# Patient Record
Sex: Male | Born: 1975 | Hispanic: Yes | Marital: Married | Smoking: Never smoker
Health system: Southern US, Community
[De-identification: ages and names within clinical notes are randomized; demographics above are authoritative.]

## PROBLEM LIST (undated history)

## (undated) DIAGNOSIS — E119 Type 2 diabetes mellitus without complications: Secondary | ICD-10-CM

## (undated) DIAGNOSIS — K8020 Calculus of gallbladder without cholecystitis without obstruction: Secondary | ICD-10-CM

---

## 2015-12-15 HISTORY — PX: SHOULDER SURGERY: SHX246

## 2015-12-21 ENCOUNTER — Encounter: Payer: Self-pay | Admitting: Emergency Medicine

## 2015-12-21 ENCOUNTER — Emergency Department: Payer: Self-pay

## 2015-12-21 ENCOUNTER — Emergency Department
Admission: EM | Admit: 2015-12-21 | Discharge: 2015-12-21 | Disposition: A | Discharge status: Home / Self Care | Payer: Self-pay | Attending: Emergency Medicine | Admitting: Emergency Medicine

## 2015-12-21 DIAGNOSIS — IMO0001 Reserved for inherently not codable concepts without codable children: Secondary | ICD-10-CM

## 2015-12-21 DIAGNOSIS — Y9289 Other specified places as the place of occurrence of the external cause: Secondary | ICD-10-CM | POA: Insufficient documentation

## 2015-12-21 DIAGNOSIS — Y998 Other external cause status: Secondary | ICD-10-CM | POA: Insufficient documentation

## 2015-12-21 DIAGNOSIS — Z7984 Long term (current) use of oral hypoglycemic drugs: Secondary | ICD-10-CM | POA: Insufficient documentation

## 2015-12-21 DIAGNOSIS — S41112A Laceration without foreign body of left upper arm, initial encounter: Secondary | ICD-10-CM | POA: Insufficient documentation

## 2015-12-21 DIAGNOSIS — Y658 Other specified misadventures during surgical and medical care: Secondary | ICD-10-CM | POA: Insufficient documentation

## 2015-12-21 DIAGNOSIS — E119 Type 2 diabetes mellitus without complications: Secondary | ICD-10-CM | POA: Insufficient documentation

## 2015-12-21 DIAGNOSIS — T814XXA Infection following a procedure, initial encounter: Secondary | ICD-10-CM | POA: Insufficient documentation

## 2015-12-21 DIAGNOSIS — S40812A Abrasion of left upper arm, initial encounter: Secondary | ICD-10-CM | POA: Insufficient documentation

## 2015-12-21 DIAGNOSIS — X58XXXA Exposure to other specified factors, initial encounter: Secondary | ICD-10-CM | POA: Insufficient documentation

## 2015-12-21 DIAGNOSIS — Z4889 Encounter for other specified surgical aftercare: Secondary | ICD-10-CM | POA: Insufficient documentation

## 2015-12-21 DIAGNOSIS — Y9389 Activity, other specified: Secondary | ICD-10-CM | POA: Insufficient documentation

## 2015-12-21 HISTORY — DX: Type 2 diabetes mellitus without complications: E11.9

## 2015-12-21 LAB — CBC
HCT: 36.4 % — ABNORMAL LOW (ref 40.0–52.0)
Hemoglobin: 12.8 g/dL — ABNORMAL LOW (ref 13.0–18.0)
MCH: 30 pg (ref 26.0–34.0)
MCHC: 35.2 g/dL (ref 32.0–36.0)
MCV: 85.3 fL (ref 80.0–100.0)
PLATELETS: 281 10*3/uL (ref 150–440)
RBC: 4.27 MIL/uL — ABNORMAL LOW (ref 4.40–5.90)
RDW: 12.8 % (ref 11.5–14.5)
WBC: 12 10*3/uL — AB (ref 3.8–10.6)

## 2015-12-21 LAB — BASIC METABOLIC PANEL
Anion gap: 9 (ref 5–15)
BUN: 14 mg/dL (ref 6–20)
CHLORIDE: 98 mmol/L — AB (ref 101–111)
CO2: 26 mmol/L (ref 22–32)
Calcium: 8.7 mg/dL — ABNORMAL LOW (ref 8.9–10.3)
Creatinine, Ser: 0.65 mg/dL (ref 0.61–1.24)
GLUCOSE: 130 mg/dL — AB (ref 65–99)
POTASSIUM: 4.1 mmol/L (ref 3.5–5.1)
SODIUM: 133 mmol/L — AB (ref 135–145)

## 2015-12-21 LAB — GLUCOSE, CAPILLARY: Glucose-Capillary: 133 mg/dL — ABNORMAL HIGH (ref 65–99)

## 2015-12-21 MED ORDER — HYDROMORPHONE HCL 2 MG PO TABS
2.0000 mg | ORAL_TABLET | Freq: Once | ORAL | Status: AC
  Administered 2015-12-21: 2 mg via ORAL
  Filled 2015-12-21: qty 1

## 2015-12-21 MED ORDER — HYDROMORPHONE HCL 2 MG PO TABS: 2.0000 mg | ORAL_TABLET | Freq: Two times a day (BID) | ORAL | Status: DC | PRN

## 2015-12-21 MED ORDER — CEPHALEXIN 500 MG PO CAPS
500.0000 mg | ORAL_CAPSULE | Freq: Four times a day (QID) | ORAL | Status: AC
Start: 2015-12-21 — End: 2015-12-31

## 2015-12-21 NOTE — ED Provider Notes (Signed)
Time Seen: Approximately *1300  I have reviewed the triage notes  Chief Complaint: No chief complaint on file.   History of Present Illness: Melvin Wood is a 40 y.o. male *who apparently had surgery performed this past Friday in [place] after a traumatic injury to his left upper extremity. The patient apparently had his surgery and [place] and was referred back home here to the [place] area. Patient himself speaks adequate English and also has a spouse with family who also speaks [place]. The patient states that he initially fell at a construction site. The pictures that his wife has with him shows the original fracture to be midshaft humerus. The patient comes to the emergency department mainly for some evaluation of some blisters and drainage from his axillary area with concerns that the wound may be infected. The patient has not had a fever at home. Past Medical History  Diagnosis Date  . Diabetes mellitus without complication (HCC)     There are no active problems to display for this patient.   Past Surgical History  Procedure Laterality Date  . Shoulder surgery  12/15/15    Past Surgical History  Procedure Laterality Date  . Shoulder surgery  12/15/15    Current Outpatient Rx  Name  Route  Sig  Dispense  Refill  . metFORMIN (GLUCOPHAGE-XR) 500 MG 24 hr tablet   Oral   Take 2,000 mg by mouth daily with supper.          Marland Kitchen oxyCODONE-acetaminophen (PERCOCET) 10-325 MG tablet   Oral   Take 2 tablets by mouth every 4 (four) hours as needed for pain (Last dose at 0800).         . cephALEXin (KEFLEX) 500 MG capsule   Oral   Take 1 capsule (500 mg total) by mouth 4 (four) times daily.   40 capsule   0   . HYDROmorphone (DILAUDID) 2 MG tablet   Oral   Take 1 tablet (2 mg total) by mouth every 12 (twelve) hours as needed for severe pain.   20 tablet   0     Allergies:  Review of patient's allergies indicates no known allergies.  Family History: No  family history on file.  Social History: Social History  Substance Use Topics  . Smoking status: Never Smoker   . Smokeless tobacco: None  . Alcohol Use: No     Review of Systems:   10 point review of systems was performed and was otherwise negative:  Constitutional: No fever Eyes: No visual disturbances ENT: No sore throat, ear pain Cardiac: No chest pain Respiratory: No shortness of breath, wheezing, or stridor Abdomen: No abdominal pain, no vomiting, No diarrhea Endocrine: No weight loss, No night sweats Extremities: No peripheral edema, cyanosis Skin: No rashes, easy bruising Neurologic: No focal weakness, trouble with speech or swollowing Urologic: No dysuria, Hematuria, or urinary frequency    Physical Exam:  ED Triage Vitals  Enc Vitals Group     BP 12/21/15 1007 115/76 mmHg     Pulse Rate 12/21/15 1007 80     Resp 12/21/15 1537 18     Temp 12/21/15 1007 98.9 F (37.2 C)     Temp Source 12/21/15 1007 Oral     SpO2 12/21/15 1007 96 %     Weight 12/21/15 1007 155 lb (70.308 kg)     Height 12/21/15 1007 5\' 4"  (1.626 m)     Head Cir --      Peak Flow --  Pain Score 12/21/15 1009 10     Pain Loc --      Pain Edu? --      Excl. in GC? --     General: Awake , Alert , and Oriented times 3; GCS 15 Head: Normal cephalic , atraumatic Eyes: Pupils equal , round, reactive to light Nose/Throat: No nasal drainage, patent upper airway without erythema or exudate.  Neck: Supple, Full range of motion, No anterior adenopathy or palpable thyroid masses Lungs: Clear to ascultation without wheezes , rhonchi, or rales Heart: Regular rate, regular rhythm without murmurs , gallops , or rubs Abdomen: Soft, non tender without rebound, guarding , or rigidity; bowel sounds positive and symmetric in all 4 quadrants. No organomegaly .        Extremities: Examination of his left axillary area shows an area that appears to be an abrasion with a small superficial laceration up  toward the upper part of his axillary area. It is some small amount of active bleeding. I do not see any drainage from his surgical site at this time. Neurologic: normal ambulation, Motor symmetric without deficits, sensory intact Skin: warm, dry, no rashes   Labs:   All laboratory work was reviewed including any pertinent negatives or positives listed below:  Labs Reviewed  CBC - Abnormal; Notable for the following:    WBC 12.0 (*)    RBC 4.27 (*)    Hemoglobin 12.8 (*)    HCT 36.4 (*)    All other components within normal limits  GLUCOSE, CAPILLARY - Abnormal; Notable for the following:    Glucose-Capillary 133 (*)    All other components within normal limits  BASIC METABOLIC PANEL - Abnormal; Notable for the following:    Sodium 133 (*)    Chloride 98 (*)    Glucose, Bld 130 (*)    Calcium 8.7 (*)    All other components within normal limits  CBG MONITORING, ED   the patient's white blood cell count is slightly elevated otherwise no significant findings     Radiology:   CLINICAL DATA: Status post fixation of a left humerus fracture 1 you week ago. Bleeding and blisters on the back of the left upper arm. Initial encounter.  EXAM: LEFT HUMERUS - 2+ VIEW  COMPARISON: None.  FINDINGS: Plate and screws and interfragmentary screws are in place for fixation of a left humerus fracture. Hardware is intact without loosening or other complicating feature. Mid diaphyseal fracture of the humerus is in anatomic position and alignment. There is some infiltration of subcutaneous fat about the upper arm. No soft tissue gas collection is identified.  IMPRESSION: Infiltration of subcutaneous fat is compatible with edema or possibly cellulitis.  Status post fixation of the left humerus fracture without evidence of complication.    I personally reviewed the radiologic studies    ED Course: * The patient's course here was uneventful and I felt this was unlikely to be a  postoperative wound infection at this time. His blood sugars have been running fine at home and he is currently and has been afebrile. I did caution his wife to keep track of his blood sugars and if they start to become elevated, increased pain, fever or any other new concerns they should consider coming back to the emergency department. His bleeding appears to be some agitation from either the wound or how he had his arm position. Patient had his wound dressed with Xeroform gauze along with a wrap. I also prescribed the patient  Keflex prophylactically.    Assessment:  Postoperative wound evaluation   Final Clinical Impression: *  Final diagnoses:  Cellulitis, wound, post-operative, initial encounter     Plan:  Patient was referred to orthopedics unassigned and information was provided to his wife and the patient about further outpatient follow-up etc. Patient was advised to return immediately if condition worsens. Patient was advised to follow up with their primary care physician or other specialized physicians involved in their outpatient care            Jennye Moccasin, MD 12/21/15 [phone]

## 2015-12-21 NOTE — ED Notes (Addendum)
Per video interpreter, pt is here due to small blisters under left axillary area. Pt with hx of diabetes, reports had surgery earlier this week.

## 2015-12-21 NOTE — Discharge Instructions (Signed)
Wound Infection A wound infection happens when a type of germ (bacteria) grows in a wound. Caring for the infection can help the wound heal. Wound infections need treatment. HOME CARE   Only take medicine as told by your doctor.  Take your antibiotic medicine as told. Finish it even if you start to feel better.  Clean the wound with mild soap and water as told. Rinse the soap off. Pat the area dry with a clean towel. Do not rub the wound.  Change any bandages (dressings) as told by your doctor.  Put cream and a bandage on the wound as told by your doctor.  If the bandage sticks, wet it with soapy water to remove the bandage.  Change the bandage if it gets wet, dirty, or starts to smell.  Take showers. Do not take baths, swim, or do anything that puts your wound under water.  Avoid exercise that makes you sweat.  If your wound itches, use a medicine that helps stop itching. Do not pick or scratch at the wound.  Keep all doctor visits as told. GET HELP RIGHT AWAY IF:   You have more puffiness (swelling), pain, or redness around the wound.  You have more yellowish-white fluid (pus) coming from the wound.  You have a bad smell coming from the wound.  Your wound breaks open more.  You have a fever. MAKE SURE YOU:   Understand these instructions.  Will watch your condition.  Will get help right away if you are not doing well or get worse.   This information is not intended to replace advice given to you by your health care provider. Make sure you discuss any questions you have with your health care provider.   Document Released: 08/13/2008 Document Revised: 01/27/2012 Document Reviewed: 04/24/2015 Elsevier Interactive Patient Education [employer]  Please return immediately if condition worsens. Please contact her primary physician or the physician you were given for referral. If you have any specialist physicians involved in her treatment and plan please also  contact them. Thank you for using [facility] emergency [facility]. There is some postoperative swelling which is difficult to identify whether or not it's infectious or just postoperative edema at this time. Return emergency department especially if develop a fever, increased pain or any other new concerns.

## 2015-12-21 NOTE — ED Notes (Signed)
Spanish Interpreter Charlane Ferretti present during interview.

## 2015-12-21 NOTE — ED Notes (Signed)
Patient was in [place] on Friday 1/27 on a construction site. He slipped and fell and injured his left shoulder.  Had surgical repair on same date. Here this AM complaining of pain and possible infection at site. Dressing is intact with serous drainage.  Dressing has not been changed since surgery. Instructed to follow up with Orthopedist in this area.

## 2016-05-13 ENCOUNTER — Other Ambulatory Visit (HOSPITAL_COMMUNITY): Payer: Self-pay | Admitting: Orthopedic Surgery

## 2016-05-13 ENCOUNTER — Ambulatory Visit (HOSPITAL_COMMUNITY)
Admission: RE | Admit: 2016-05-13 | Discharge: 2016-05-13 | Disposition: A | Discharge status: Home / Self Care | Payer: Self-pay | Source: Ambulatory Visit | Attending: Orthopedic Surgery | Admitting: Orthopedic Surgery

## 2016-05-13 DIAGNOSIS — Z01818 Encounter for other preprocedural examination: Secondary | ICD-10-CM | POA: Insufficient documentation

## 2016-05-13 DIAGNOSIS — S4992XA Unspecified injury of left shoulder and upper arm, initial encounter: Secondary | ICD-10-CM

## 2016-08-01 ENCOUNTER — Emergency Department
Admission: EM | Admit: 2016-08-01 | Discharge: 2016-08-02 | Disposition: A | Discharge status: Home / Self Care | Payer: Managed Care, Other (non HMO) | Attending: Emergency Medicine | Admitting: Emergency Medicine

## 2016-08-01 ENCOUNTER — Encounter: Payer: Self-pay | Admitting: Emergency Medicine

## 2016-08-01 ENCOUNTER — Emergency Department: Payer: Managed Care, Other (non HMO)

## 2016-08-01 DIAGNOSIS — E119 Type 2 diabetes mellitus without complications: Secondary | ICD-10-CM | POA: Insufficient documentation

## 2016-08-01 DIAGNOSIS — R112 Nausea with vomiting, unspecified: Secondary | ICD-10-CM

## 2016-08-01 DIAGNOSIS — K8000 Calculus of gallbladder with acute cholecystitis without obstruction: Secondary | ICD-10-CM | POA: Diagnosis not present

## 2016-08-01 DIAGNOSIS — R1011 Right upper quadrant pain: Secondary | ICD-10-CM

## 2016-08-01 DIAGNOSIS — Z7984 Long term (current) use of oral hypoglycemic drugs: Secondary | ICD-10-CM | POA: Diagnosis not present

## 2016-08-01 LAB — CBC
HCT: 48 % (ref 40.0–52.0)
HEMOGLOBIN: 16.7 g/dL (ref 13.0–18.0)
MCH: 29.2 pg (ref 26.0–34.0)
MCHC: 34.7 g/dL (ref 32.0–36.0)
MCV: 84.2 fL (ref 80.0–100.0)
PLATELETS: 263 10*3/uL (ref 150–440)
RBC: 5.7 MIL/uL (ref 4.40–5.90)
RDW: 12.7 % (ref 11.5–14.5)
WBC: 16 10*3/uL — ABNORMAL HIGH (ref 3.8–10.6)

## 2016-08-01 LAB — COMPREHENSIVE METABOLIC PANEL
ALBUMIN: 4.7 g/dL (ref 3.5–5.0)
ALK PHOS: 129 U/L — AB (ref 38–126)
ALT: 62 U/L (ref 17–63)
ANION GAP: 8 (ref 5–15)
AST: 41 U/L (ref 15–41)
BILIRUBIN TOTAL: 0.8 mg/dL (ref 0.3–1.2)
BUN: 13 mg/dL (ref 6–20)
CALCIUM: 9.4 mg/dL (ref 8.9–10.3)
CO2: 29 mmol/L (ref 22–32)
CREATININE: 0.86 mg/dL (ref 0.61–1.24)
Chloride: 102 mmol/L (ref 101–111)
GFR calc Af Amer: >60 mL/min (ref >60)
GFR calc non Af Amer: >60 mL/min (ref >60)
GLUCOSE: 228 mg/dL — AB (ref 65–99)
Potassium: 3.6 mmol/L (ref 3.5–5.1)
SODIUM: 139 mmol/L (ref 135–145)
TOTAL PROTEIN: 8.1 g/dL (ref 6.5–8.1)

## 2016-08-01 LAB — URINALYSIS COMPLETE WITH MICROSCOPIC (ARMC ONLY)
BILIRUBIN URINE: NEGATIVE
GLUCOSE, UA: 50 mg/dL — AB
Hgb urine dipstick: NEGATIVE
KETONES UR: NEGATIVE mg/dL
Leukocytes, UA: NEGATIVE
NITRITE: NEGATIVE
PH: 6 (ref 5.0–8.0)
Protein, ur: NEGATIVE mg/dL
RBC / HPF: NONE SEEN RBC/hpf (ref 0–5)
Specific Gravity, Urine: 1.024 (ref 1.005–1.030)

## 2016-08-01 LAB — TROPONIN I: Troponin I: <0.03 ng/mL (ref <0.03)

## 2016-08-01 LAB — LIPASE, BLOOD: Lipase: 33 U/L (ref 11–51)

## 2016-08-01 MED ORDER — ONDANSETRON HCL 4 MG/2ML IJ SOLN
INTRAMUSCULAR | Status: AC
  Filled 2016-08-01: qty 2

## 2016-08-01 MED ORDER — ONDANSETRON HCL 4 MG/2ML IJ SOLN
4.0000 mg | Freq: Once | INTRAMUSCULAR | Status: AC
  Administered 2016-08-01: 4 mg via INTRAVENOUS

## 2016-08-01 MED ORDER — MORPHINE SULFATE (PF) 4 MG/ML IV SOLN
4.0000 mg | Freq: Once | INTRAVENOUS | Status: AC
  Administered 2016-08-01: 4 mg via INTRAVENOUS

## 2016-08-01 MED ORDER — MORPHINE SULFATE (PF) 4 MG/ML IV SOLN
INTRAVENOUS | Status: AC
  Filled 2016-08-01: qty 1

## 2016-08-01 NOTE — ED Notes (Signed)
Pt c/o URQ abdominal pain that began suddenly at approx 2030 after eating. Pt describes pain as sharp stabbing. Pt reports 4 emeses

## 2016-08-01 NOTE — ED Provider Notes (Signed)
[facility] Emergency Department Provider Note   ____________________________________________   First MD Initiated Contact with Patient 08/01/16 2305     (approximate)  I have reviewed the triage vital signs and the nursing notes.   HISTORY  Chief Complaint Abdominal Pain  Patient requests his wife translate  HPI Melvin Wood is a 40 y.o. male who presents to the ED from home with a chief complaint of abdominal pain. Reports right upper quadrant abdominal pain after eating fish; onset approximately 2 hours prior to arrival. Accompanied by nausea and vomiting. Denies associated fever, chills, chest pain, shortness of breath, dysuria, diarrhea. Denies recent travel or trauma. Nothing makes his symptoms better or worse.   Past Medical History:  Diagnosis Date  . Diabetes mellitus without complication (HCC)     There are no active problems to display for this patient.   Past Surgical History:  Procedure Laterality Date  . SHOULDER SURGERY  12/15/15    Prior to Admission medications   Medication Sig Start Date End Date Taking? Authorizing Provider  cephALEXin (KEFLEX) 500 MG capsule Take 1 capsule (500 mg total) by mouth 3 (three) times daily. 08/02/16   Irean Hong, MD  HYDROmorphone (DILAUDID) 2 MG tablet Take 1 tablet (2 mg total) by mouth every 12 (twelve) hours as needed for severe pain. 12/21/15   Jennye Moccasin, MD  metFORMIN (GLUCOPHAGE-XR) 500 MG 24 hr tablet Take 2,000 mg by mouth daily with supper.     Historical Provider, MD  ondansetron (ZOFRAN ODT) 4 MG disintegrating tablet Take 1 tablet (4 mg total) by mouth every 8 (eight) hours as needed for nausea or vomiting. 08/02/16   Irean Hong, MD  oxyCODONE-acetaminophen (ROXICET) 5-325 MG tablet Take 1 tablet by mouth every 4 (four) hours as needed for severe pain. 08/02/16   Irean Hong, MD    Allergies Review of patient's allergies indicates no known allergies.  No family history on  file.  Social History Social History  Substance Use Topics  . Smoking status: Never Smoker  . Smokeless tobacco: Never Used  . Alcohol use No    Review of Systems  Constitutional: No fever/chills. Eyes: No visual changes. ENT: No sore throat. Cardiovascular: Denies chest pain. Respiratory: Denies shortness of breath. Gastrointestinal: Positive for abdominal pain, nausea and vomiting.  No diarrhea.  No constipation. Genitourinary: Negative for dysuria. Musculoskeletal: Negative for back pain. Skin: Negative for rash. Neurological: Negative for headaches, focal weakness or numbness.  10-point ROS otherwise negative.  ____________________________________________   PHYSICAL EXAM:  VITAL SIGNS: ED Triage Vitals  Enc Vitals Group     BP 08/01/16 2158 (!) 214/191     Pulse Rate 08/01/16 2158 67     Resp 08/01/16 2158 18     Temp 08/01/16 2158 98 F (36.7 C)     Temp Source 08/01/16 2158 Oral     SpO2 08/01/16 2158 98 %     Weight --      Height --      Head Circumference --      Peak Flow --      Pain Score 08/01/16 2207 10     Pain Loc --      Pain Edu? --      Excl. in GC? --     Constitutional: Alert and oriented. Well appearing and in mild acute distress. Eyes: Conjunctivae are normal. PERRL. EOMI. Head: Atraumatic. Nose: No congestion/rhinnorhea. Mouth/Throat: Mucous membranes are moist.  Oropharynx  non-erythematous. Neck: No stridor.   Cardiovascular: Normal rate, regular rhythm. Grossly normal heart sounds.  Good peripheral circulation. Respiratory: Normal respiratory effort.  No retractions. Lungs CTAB. Gastrointestinal: Soft and moderately tender to palpation epigastrium and right upper quadrant without rebound or guarding. No distention. No abdominal bruits. No CVA tenderness. Musculoskeletal: No lower extremity tenderness nor edema.  No joint effusions. Neurologic:  Normal speech and language. No gross focal neurologic deficits are appreciated. No gait  instability. Skin:  Skin is warm, dry and intact. No rash noted. Psychiatric: Mood and affect are normal. Speech and behavior are normal.  ____________________________________________   LABS (all labs ordered are listed, but only abnormal results are displayed)  Labs Reviewed  COMPREHENSIVE METABOLIC PANEL - Abnormal; Notable for the following:       Result Value   Glucose, Bld 228 (*)    Alkaline Phosphatase 129 (*)    All other components within normal limits  CBC - Abnormal; Notable for the following:    WBC 16.0 (*)    All other components within normal limits  URINALYSIS COMPLETEWITH MICROSCOPIC ([facility] ONLY) - Abnormal; Notable for the following:    Color, Urine YELLOW (*)    APPearance CLOUDY (*)    Glucose, UA 50 (*)    Bacteria, UA RARE (*)    Squamous Epithelial / LPF 0-5 (*)    All other components within normal limits  LIPASE, BLOOD  TROPONIN I   ____________________________________________  EKG  ED ECG REPORT I, SUNG,JADE J, the attending physician, personally viewed and interpreted this ECG.   Date: 08/01/2016  EKG Time: 2207  Rate: 61  Rhythm: normal EKG, normal sinus rhythm  Axis: Normal  Intervals:none  ST&T Change: Nonspecific  ____________________________________________  RADIOLOGY  RUQ ultrasound interpreted per Dr. Chase Picket: 1. Large, nonmobile stone in the neck of the gallbladder with  positive sonographic Murphy sign. These findings are compatible with  acute cholecystitis.  2. No pericholecystic fluid or gallbladder wall thickening.  3. Hepatic steatosis.   ____________________________________________   PROCEDURES  Procedure(s) performed: None  Procedures  Critical Care performed: No  ____________________________________________   INITIAL IMPRESSION / ASSESSMENT AND PLAN / ED COURSE  Pertinent labs & imaging results that were available during my care of the patient were reviewed by me and considered in my medical decision  making (see chart for details).  Recent-year-old male who presents with right upper quadrant epigastric pain after eating. Laboratory and urinalysis results remarkable for leukocytosis. Patient was medicated prior to my exam and is feeling slightly better. RUQ ultrasound pending.  Clinical Course  Comment By Time  Updated patient and spouse of ultrasound results. Pain is well-controlled after 1 dose of morphine, no further emesis, and patient has tolerated PO. Discussed with Dr. Orvis Brill who recommends discharge home with close follow-up in the clinic next week. Strict return precautions given. Patient and spouse verbalize understanding and agree with plan of care. Irean Hong, MD 09/15 [phone]     ____________________________________________   FINAL CLINICAL IMPRESSION(S) / ED DIAGNOSES  Final diagnoses:  Right upper quadrant pain  Non-intractable vomiting with nausea, vomiting of unspecified type  Calculus of gallbladder with acute cholecystitis without obstruction      NEW MEDICATIONS STARTED DURING THIS VISIT:  New Prescriptions   CEPHALEXIN (KEFLEX) 500 MG CAPSULE    Take 1 capsule (500 mg total) by mouth 3 (three) times daily.   ONDANSETRON (ZOFRAN ODT) 4 MG DISINTEGRATING TABLET    Take 1 tablet (4 mg total)  by mouth every 8 (eight) hours as needed for nausea or vomiting.   OXYCODONE-ACETAMINOPHEN (ROXICET) 5-325 MG TABLET    Take 1 tablet by mouth every 4 (four) hours as needed for severe pain.     Note:  This document was prepared using Dragon voice recognition software and may include unintentional dictation errors.    Irean HongJade J Sung, MD 08/02/16 [phone]

## 2016-08-01 NOTE — ED Triage Notes (Signed)
Patient ambulatory to triage, appears uncomfortable, grimacing, pale Pt reports right upper quad pain accomp by N/V x 2 hours; denies hx of same

## 2016-08-02 MED ORDER — CEPHALEXIN 500 MG PO CAPS: 500.0000 mg | ORAL_CAPSULE | Freq: Three times a day (TID) | ORAL | 0 refills | Status: DC

## 2016-08-02 MED ORDER — OXYCODONE-ACETAMINOPHEN 5-325 MG PO TABS: 1.0000 | ORAL_TABLET | ORAL | 0 refills | Status: DC | PRN

## 2016-08-02 MED ORDER — ONDANSETRON 4 MG PO TBDP
4.0000 mg | ORAL_TABLET | Freq: Three times a day (TID) | ORAL | 0 refills | Status: DC | PRN
Start: 2016-08-02 — End: 2016-08-13

## 2016-08-02 NOTE — ED Notes (Signed)
MD Sung at bedside. 

## 2016-08-02 NOTE — Discharge Instructions (Signed)
1. Take antibiotic as prescribed (Keflex 500 mg 3 times daily 7 days). 2. Take pain and nausea medicines as needed (Percocet/Zofran). 3. Clear liquids 12 hours then bland diet until you see the surgeon. Avoid heavy, greasy, spicy foods and alcohol. 4. Return to the ER for worsening symptoms, persistent vomiting, fever or other concerns.

## 2016-08-06 ENCOUNTER — Telehealth: Payer: Self-pay

## 2016-08-06 NOTE — Telephone Encounter (Signed)
Called patient to schedule follow-up from ER. Patient does not speak English but asked me to speak with his wife.   She states that patient is feeling better but still has dull ache in abdomen after eating. Denies nausea and vomiting.  Patient scheduled to be seen next week in office. Could not come in prior to this date due to transportation difficulties.

## 2016-08-13 ENCOUNTER — Ambulatory Visit (INDEPENDENT_AMBULATORY_CARE_PROVIDER_SITE_OTHER): Payer: 59 | Admitting: General Surgery

## 2016-08-13 ENCOUNTER — Telehealth: Payer: Self-pay

## 2016-08-13 ENCOUNTER — Encounter: Payer: Self-pay | Admitting: General Surgery

## 2016-08-13 VITALS — BP 130/80 | HR 59 | Temp 98.1°F | Ht 64.0 in | Wt 169.0 lb

## 2016-08-13 DIAGNOSIS — K8050 Calculus of bile duct without cholangitis or cholecystitis without obstruction: Secondary | ICD-10-CM | POA: Diagnosis not present
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
## 2016-08-13 NOTE — Patient Instructions (Signed)
You have requested to have your gallbladder removed. We will arrange for this to be done on 08/28/16 at [facility] with Dr. Tonita CongWoodham.  You will most likely be out of work 1-2 weeks for this surgery. You will return after your post-op appointment with a lifting restriction for approximately 4 more weeks.  You will be able to eat anything you would like to following surgery. But, start by eating a bland diet and advance this as tolerated.  Please see the (blue)pre-care form that you have been given today. If you have any questions, please call our office.  Laparoscopic Cholecystectomy Laparoscopic cholecystectomy is surgery to remove the gallbladder. The gallbladder is located in the upper right part of the abdomen, behind the liver. It is a storage sac for bile, which is produced in the liver. Bile aids in the digestion and absorption of fats. Cholecystectomy is often done for inflammation of the gallbladder (cholecystitis). This condition is usually caused by a buildup of gallstones (cholelithiasis) in the gallbladder. Gallstones can block the flow of bile, and that can result in inflammation and pain. In severe cases, emergency surgery may be required. If emergency surgery is not required, you will have time to prepare for the procedure. Laparoscopic surgery is an alternative to open surgery. Laparoscopic surgery has a shorter recovery time. Your common bile duct may also need to be examined during the procedure. If stones are found in the common bile duct, they may be removed. LET [facility] PROVID[facility] KNOW ABOUT:  Any allergies you have.  All medicines you are taking, including vitamins, herbs, eye drops, creams, and over-the-counter medicines.  Previous problems you or members of your family have had with the use of anesthetics.  Any blood disorders you have.  Previous surgeries you have had.    Any medical conditions you have. RISKS AND COMPLICATIONS Generally, this is a  safe procedure. However, problems may occur, including:  Infection.  Bleeding.  Allergic reactions to medicines.  Damage to other structures or organs.  A stone remaining in the common bile duct.  A bile leak from the cyst duct that is clipped when your gallbladder is removed.  The need to convert to open surgery, which requires a larger incision in the abdomen. This may be necessary if your surgeon thinks that it is not safe to continue with a laparoscopic procedure. BEFORE THE PROCEDURE  Ask your health care provider about:  Changing or stopping your regular medicines. This is especially important if you are taking diabetes medicines or blood thinners.  Taking medicines such as aspirin and ibuprofen. These medicines can thin your blood. Do not take these medicines before your procedure if your health care provider instructs you not to.  Follow instructions from your health care provider about eating or drinking restrictions.  Let your health care provider know if you develop a cold or an infection before surgery.  Plan to have someone take you home after the procedure.  Ask your health care provider how your surgical site will be marked or identified.  You may be given antibiotic medicine to help prevent infection. PROCEDURE  To reduce your risk of infection:  Your health care team will wash or sanitize their hands.  Your skin will be washed with soap.  An IV tube may be inserted into one of your veins.  You will be given a medicine to make you fall asleep (general anesthetic).  A breathing tube will be placed in your mouth.  The surgeon  will make several small cuts (incisions) in your abdomen.  A thin, lighted tube (laparoscope) that has a tiny camera on the end will be inserted through one of the small incisions. The camera on the laparoscope will send a picture to a TV screen (monitor) in the operating room. This will give the surgeon a good view inside your  abdomen.  A gas will be pumped into your abdomen. This will expand your abdomen to give the surgeon more room to perform the surgery.  Other tools that are needed for the procedure will be inserted through the other incisions. The gallbladder will be removed through one of the incisions.  After your gallbladder has been removed, the incisions will be closed with stitches (sutures), staples, or skin glue.  Your incisions may be covered with a bandage (dressing). The procedure may vary among health care providers and hospitals. AFTER THE PROCEDURE  Your blood pressure, heart rate, breathing rate, and blood oxygen level will be monitored often until the medicines you were given have worn off.  You will be given medicines as needed to control your pain.   This information is not intended to replace advice given to you by your health care provider. Make sure you discuss any questions you have with your health care provider.   Document Released: 11/04/2005 Document Revised: 07/26/2015 Document Reviewed: 06/16/2013 Elsevier Interactive Patient Education 2016 [place] Diet for Pancreatitis or Gallbladder Conditions A low-fat diet can be helpful if you have pancreatitis or a gallbladder condition. With these conditions, your pancreas and gallbladder have trouble digesting fats. A healthy eating plan with less fat will help rest your pancreas and gallbladder and reduce your symptoms. WHAT DO I NEED TO KNOW ABOUT THIS DIET?  Eat a low-fat diet.  Reduce your fat intake to less than 20-30% of your total daily calories. This is less than 50-60 g of fat per day.  Remember that you need some fat in your diet. Ask your dietician what your daily goal should be.  Choose nonfat and low-fat healthy foods. Look for the words "nonfat," "low fat," or "fat free."  As a guide, look on the label and choose foods with less than 3 g of fat per serving. Eat only one serving.  Avoid  alcohol.  Do not smoke. If you need help quitting, talk with your health care provider.  Eat small frequent meals instead of three large heavy meals. WHAT FOODS CAN I EAT? Grains Include healthy grains and starches such as potatoes, wheat bread, fiber-rich cereal, and brown rice. Choose whole grain options whenever possible. In adults, whole grains should account for 45-65% of your daily calories.  Fruits and Vegetables Eat plenty of fruits and vegetables. Fresh fruits and vegetables add fiber to your diet. Meats and Other Protein Sources Eat lean meat such as chicken and pork. Trim any fat off of meat before cooking it. Eggs, fish, and beans are other sources of protein. In adults, these foods should account for 10-35% of your daily calories. Dairy Choose low-fat milk and dairy options. Dairy includes fat and protein, as well as calcium.  Fats and Oils Limit high-fat foods such as fried foods, sweets, baked goods, sugary drinks.  Other Creamy sauces and condiments, such as mayonnaise, can add extra fat. Think about whether or not you need to use them, or use smaller amounts or low fat options. WHAT FOODS ARE NOT RECOMMENDED?  High fat foods, such as:  [employer].  Ice  cream.  French toast.  Sweet rolls.  Pizza.  Cheese bread.  Foods covered with batter, butter, creamy sauces, or cheese.  Fried foods.  Sugary drinks and desserts.  Foods that cause gas or bloating   This information is not intended to replace advice given to you by your health care provider. Make sure you discuss any questions you have with your health care provider.   Document Released: 11/09/2013 Document Reviewed: 11/09/2013 Elsevier Interactive Patient Education [employer].

## 2016-08-13 NOTE — Telephone Encounter (Signed)
Please schedule Laparoscopic Cholecystectomy for 08/28/16 with [facility]

## 2016-08-15 NOTE — Telephone Encounter (Signed)
I have called patient to go over surgery information. No answer. I have left a message on voicemail for patient to call back to go over information.

## 2016-08-16 NOTE — Telephone Encounter (Signed)
Pt advised of pre op date/time and sx date. Sx: 08/28/16 with Dr Devoria AlbeWoodham--Laparoscopic cholecystectomy.  Pre op: 08/22/16 @ 9:00am--Office.   Patient made aware to call [phone], between 1-3:00pm the day before surgery, to find out what time to arrive.

## 2016-08-22 ENCOUNTER — Encounter
Admission: RE | Admit: 2016-08-22 | Discharge: 2016-08-22 | Disposition: A | Discharge status: Home / Self Care | Payer: Managed Care, Other (non HMO) | Source: Ambulatory Visit | Attending: General Surgery | Admitting: General Surgery

## 2016-08-22 DIAGNOSIS — Z01818 Encounter for other preprocedural examination: Secondary | ICD-10-CM | POA: Insufficient documentation

## 2016-08-22 HISTORY — DX: Calculus of gallbladder without cholecystitis without obstruction: K80.20

## 2016-08-22 MED ORDER — CHLORHEXIDINE GLUCONATE CLOTH 2 % EX PADS
6.0000 | MEDICATED_PAD | Freq: Once | CUTANEOUS | Status: DC
  Filled 2016-08-22: qty 6

## 2016-08-22 NOTE — Patient Instructions (Signed)
Your procedure is scheduled on: 08/28/16 Wed Su procedimiento est programado para: Report to 2nd floor medical mall Presntese a: To find out your arrival time please call [phone] between 1PM - 3PM on 09/05/16 Ties Para saber su hora de llegada por favor llame al [phone] entre la 1PM - 3PM el da:  Remember: Instructions that are not followed completely may result in serious medical risk, up to and including death, or upon the discretion of your surgeon and anesthesiologist your surgery may need to be rescheduled.  Recuerde: Las instrucciones que no se siguen completamente Armed forces logistics/support/administrative officer en un riesgo de salud grave, incluyendo hasta la [place] o a discrecin de su cirujano y Scientific laboratory technician, su ciruga se puede posponer.   __x__ 1. Do not eat food or drink liquids after midnight. No gum chewing or hard candies.  No coma alimentos ni tome lquidos despus de la medianoche.  No mastique chicle ni caramelos  duros.     ____ 2. No alcohol for 24 hours before or after surgery.    No tome alcohol durante las 24 horas antes ni despus de la [place].   ____ 3. Bring all medications with you on the day of surgery if instructed.    Lleve todos los medicamentos con usted el da de su ciruga si se le ha indicado as.   ____ 4. Notify your doctor if there is any change in your medical condition (cold, fever,                             infections).    Informe a su mdico si hay algn cambio en su condicin mdica (resfriado, fiebre, infecciones).   Do not wear jewelry, make-up, hairpins, clips or nail polish.  No use joyas, maquillajes, pinzas/ganchos para el cabello ni esmalte de uas.  Do not wear lotions, powders, or perfumes. You may wear deodorant.  No use lociones, polvos o perfumes.  Puede usar desodorante.    Do not shave 48 hours prior to surgery. Men may shave face and neck.  No se afeite 48 horas antes de la [place].  Los hombres pueden [employer] cara y el cuello.   Do not  bring valuables to the hospital.   No lleve objetos de valor al hospital.  [facility] is not responsible for any belongings or valuables.  [place] no se hace responsable de ningn tipo de pertenencias u objetos de Licensed conveyancer.               Contacts, dentures or bridgework may not be worn into surgery.  Los lentes de [place], las dentaduras postizas o puentes no se pueden usar en la [place].  Leave your suitcase in the car. After surgery it may be brought to your room.  Deje su maleta en el auto.  Despus de la ciruga podr traerla a su habitacin.  For patients admitted to the hospital, discharge time is determined by your treatment team.  Para los pacientes que sean ingresados al hospital, el tiempo en el cual se le dar de alta es determinado por su                equipo de [place].   Patients discharged the day of surgery will not be allowed to drive home. A los pacientes que se les da de alta el mismo da de la ciruga no se les permitir conducir a Higher education careers adviser.   Please read over the following fact  sheets that you were given: Por favor lea las siguientes hojas de informacin que le dieron:    ____ Take these medicines the morning of surgery with A SIP OF WATER:          Johnson & Johnsonome estas medicinas la maana de la ciruga con UN SORBO DE AGUA:  1. None  2.   3.   4.       5.  6.  ____ Fleet Enema (as directed)          Enema de Fleet (segn lo indicado)    _x___ Use CHG Soap as directed          Utilice el jabn de CHG segn lo indicado  ____ Use inhalers on the day of surgery          Use los inhaladores el da de la ciruga  __x__ Stop metformin 2 days prior to surgery          Deje de tomar el metformin 2 das antes de la ciruga    ____ Take 1/2 of usual insulin dose the night before surgery and none on the morning of surgery           Tome la mitad de la dosis habitual de insulina la noche antes de la [place] y no tome nada en la maana de la             ciruga  ____  Stop Coumadin/Plavix/aspirin on           Deje de tomar el Coumadin/Plavix/aspirina el da:  ____ Stop Anti-inflammatories on          Deje de tomar antiinflamatorios el da:   ____ Stop supplements until after surgery            Deje de tomar suplementos hasta despus de la ciruga  ____ Bring C-Pap to the hospital          Lleve el C-Pap al hospital

## 2016-08-28 ENCOUNTER — Ambulatory Visit
Admission: RE | Admit: 2016-08-28 | Discharge: 2016-08-28 | Disposition: A | Discharge status: Home / Self Care | Payer: Managed Care, Other (non HMO) | Source: Ambulatory Visit | Attending: General Surgery | Admitting: General Surgery

## 2016-08-28 ENCOUNTER — Encounter: Payer: Self-pay | Admitting: *Deleted

## 2016-08-28 ENCOUNTER — Encounter
Admission: RE | Disposition: A | Discharge status: Home / Self Care | Payer: Self-pay | Source: Ambulatory Visit | Attending: General Surgery

## 2016-08-28 ENCOUNTER — Ambulatory Visit: Payer: Managed Care, Other (non HMO) | Admitting: Anesthesiology

## 2016-08-28 DIAGNOSIS — K8050 Calculus of bile duct without cholangitis or cholecystitis without obstruction: Secondary | ICD-10-CM | POA: Diagnosis not present

## 2016-08-28 DIAGNOSIS — E119 Type 2 diabetes mellitus without complications: Secondary | ICD-10-CM | POA: Diagnosis not present

## 2016-08-28 DIAGNOSIS — Z7984 Long term (current) use of oral hypoglycemic drugs: Secondary | ICD-10-CM | POA: Insufficient documentation

## 2016-08-28 DIAGNOSIS — K8010 Calculus of gallbladder with chronic cholecystitis without obstruction: Secondary | ICD-10-CM | POA: Diagnosis not present

## 2016-08-28 HISTORY — PX: CHOLECYSTECTOMY: SHX55

## 2016-08-28 LAB — GLUCOSE, CAPILLARY
GLUCOSE-CAPILLARY: 158 mg/dL — AB (ref 65–99)
Glucose-Capillary: 199 mg/dL — ABNORMAL HIGH (ref 65–99)

## 2016-08-28 SURGERY — LAPAROSCOPIC CHOLECYSTECTOMY
Anesthesia: General | Wound class: Clean Contaminated

## 2016-08-28 MED ORDER — ONDANSETRON HCL 4 MG/2ML IJ SOLN
INTRAMUSCULAR | Status: DC | PRN
  Administered 2016-08-28: 4 mg via INTRAVENOUS

## 2016-08-28 MED ORDER — LIDOCAINE HCL (CARDIAC) 20 MG/ML IV SOLN
INTRAVENOUS | Status: DC | PRN
  Administered 2016-08-28: 100 mg via INTRAVENOUS

## 2016-08-28 MED ORDER — FENTANYL CITRATE (PF) 100 MCG/2ML IJ SOLN
INTRAMUSCULAR | Status: AC
  Administered 2016-08-28: 25 ug via INTRAVENOUS
  Filled 2016-08-28: qty 2

## 2016-08-28 MED ORDER — ROCURONIUM BROMIDE 100 MG/10ML IV SOLN
INTRAVENOUS | Status: DC | PRN
  Administered 2016-08-28: 10 mg via INTRAVENOUS
  Administered 2016-08-28: 40 mg via INTRAVENOUS

## 2016-08-28 MED ORDER — LIDOCAINE HCL 1 % IJ SOLN
INTRAMUSCULAR | Status: DC | PRN
  Administered 2016-08-28: 30 mL via SUBCUTANEOUS

## 2016-08-28 MED ORDER — FENTANYL CITRATE (PF) 100 MCG/2ML IJ SOLN
25.0000 ug | INTRAMUSCULAR | Status: DC | PRN
  Administered 2016-08-28 (×2): 25 ug via INTRAVENOUS

## 2016-08-28 MED ORDER — MIDAZOLAM HCL 2 MG/2ML IJ SOLN
INTRAMUSCULAR | Status: DC | PRN
  Administered 2016-08-28: 2 mg via INTRAVENOUS

## 2016-08-28 MED ORDER — EPHEDRINE SULFATE 50 MG/ML IJ SOLN
INTRAMUSCULAR | Status: DC | PRN
  Administered 2016-08-28: 10 mg via INTRAVENOUS
  Administered 2016-08-28: 5 mg via INTRAVENOUS

## 2016-08-28 MED ORDER — FAMOTIDINE 20 MG PO TABS
20.0000 mg | ORAL_TABLET | Freq: Once | ORAL | Status: AC
  Administered 2016-08-28: 20 mg via ORAL

## 2016-08-28 MED ORDER — OXYCODONE-ACETAMINOPHEN 5-325 MG PO TABS
ORAL_TABLET | ORAL | Status: AC
  Filled 2016-08-28: qty 1

## 2016-08-28 MED ORDER — NEOSTIGMINE METHYLSULFATE 10 MG/10ML IV SOLN
INTRAVENOUS | Status: DC | PRN
  Administered 2016-08-28: 4 mg via INTRAVENOUS

## 2016-08-28 MED ORDER — OXYCODONE-ACETAMINOPHEN 5-325 MG PO TABS: 1.0000 | ORAL_TABLET | ORAL | 0 refills | Status: AC | PRN

## 2016-08-28 MED ORDER — DEXTROSE 5 % IV SOLN
1.0000 g | INTRAVENOUS | Status: AC
  Administered 2016-08-28: 1 g via INTRAVENOUS
  Filled 2016-08-28: qty 1

## 2016-08-28 MED ORDER — FAMOTIDINE 20 MG PO TABS
ORAL_TABLET | ORAL | Status: AC
  Filled 2016-08-28: qty 1

## 2016-08-28 MED ORDER — PROPOFOL 10 MG/ML IV BOLUS
INTRAVENOUS | Status: DC | PRN
  Administered 2016-08-28: 150 mg via INTRAVENOUS

## 2016-08-28 MED ORDER — GLYCOPYRROLATE 0.2 MG/ML IJ SOLN
INTRAMUSCULAR | Status: DC | PRN
  Administered 2016-08-28: .8 mg via INTRAVENOUS

## 2016-08-28 MED ORDER — LIDOCAINE HCL (PF) 1 % IJ SOLN
INTRAMUSCULAR | Status: AC
  Filled 2016-08-28: qty 30

## 2016-08-28 MED ORDER — ONDANSETRON HCL 4 MG/2ML IJ SOLN: 4.0000 mg | Freq: Once | INTRAMUSCULAR | Status: DC | PRN

## 2016-08-28 MED ORDER — SODIUM CHLORIDE 0.9 % IV SOLN
INTRAVENOUS | Status: DC
  Administered 2016-08-28: 11:00:00 via INTRAVENOUS

## 2016-08-28 MED ORDER — OXYCODONE-ACETAMINOPHEN 5-325 MG PO TABS
1.0000 | ORAL_TABLET | ORAL | Status: DC | PRN
  Administered 2016-08-28: 1 via ORAL

## 2016-08-28 MED ORDER — BUPIVACAINE HCL (PF) 0.5 % IJ SOLN
INTRAMUSCULAR | Status: AC
  Filled 2016-08-28: qty 30

## 2016-08-28 MED ORDER — FENTANYL CITRATE (PF) 100 MCG/2ML IJ SOLN
INTRAMUSCULAR | Status: DC | PRN
  Administered 2016-08-28 (×2): 50 ug via INTRAVENOUS
  Administered 2016-08-28: 150 ug via INTRAVENOUS

## 2016-08-28 SURGICAL SUPPLY — 44 items
ADHESIVE MASTISOL STRL (MISCELLANEOUS) ×2 IMPLANT
APPLIER CLIP ROT 10 11.4 M/L (STAPLE) ×2
BAG COUNTER SPONGE EZ (MISCELLANEOUS) IMPLANT
BLADE CLIPPER SURG (BLADE) ×2 IMPLANT
BLADE SURG SZ11 CARB STEEL (BLADE) ×2 IMPLANT
CANISTER SUCT 1200ML W/VALVE (MISCELLANEOUS) ×2 IMPLANT
CATH CHOLANG 76X19 KUMAR (CATHETERS) IMPLANT
CHLORAPREP W/TINT 26ML (MISCELLANEOUS) ×2 IMPLANT
CLIP APPLIE ROT 10 11.4 M/L (STAPLE) ×1 IMPLANT
CONRAY 60ML FOR OR (MISCELLANEOUS) IMPLANT
DRAPE SHEET LG 3/4 BI-LAMINATE (DRAPES) ×2 IMPLANT
DRESSING TELFA 4X3 1S ST N-ADH (GAUZE/BANDAGES/DRESSINGS) ×2 IMPLANT
DRSG TEGADERM 2-3/8X2-3/4 SM (GAUZE/BANDAGES/DRESSINGS) ×8 IMPLANT
ELECT REM PT RETURN 9FT ADLT (ELECTROSURGICAL) ×2
ELECTRODE REM PT RTRN 9FT ADLT (ELECTROSURGICAL) ×1 IMPLANT
GLOVE BIO SURGEON STRL SZ 6.5 (GLOVE) ×4 IMPLANT
GLOVE BIO SURGEON STRL SZ7.5 (GLOVE) ×6 IMPLANT
GLOVE INDICATOR 7.0 STRL GRN (GLOVE) ×4 IMPLANT
GLOVE INDICATOR 8.0 STRL GRN (GLOVE) ×2 IMPLANT
GOWN STRL REUS W/ TWL LRG LVL3 (GOWN DISPOSABLE) ×4 IMPLANT
GOWN STRL REUS W/TWL LRG LVL3 (GOWN DISPOSABLE) ×4
GRASPER SUT TROCAR 14GX15 (MISCELLANEOUS) ×2 IMPLANT
IRRIGATION STRYKERFLOW (MISCELLANEOUS) IMPLANT
IRRIGATOR STRYKERFLOW (MISCELLANEOUS)
IV NS 1000ML (IV SOLUTION)
IV NS 1000ML BAXH (IV SOLUTION) IMPLANT
L-HOOK LAP DISP 36CM (ELECTROSURGICAL) ×2
LABEL OR SOLS (LABEL) ×2 IMPLANT
LHOOK LAP DISP 36CM (ELECTROSURGICAL) ×1 IMPLANT
NEEDLE HYPO 25X1 1.5 SAFETY (NEEDLE) ×2 IMPLANT
NEEDLE VERESS 14GA 120MM (NEEDLE) ×2 IMPLANT
NS IRRIG 500ML POUR BTL (IV SOLUTION) ×2 IMPLANT
PACK LAP CHOLECYSTECTOMY (MISCELLANEOUS) ×2 IMPLANT
PENCIL ELECTRO HAND CTR (MISCELLANEOUS) ×2 IMPLANT
POUCH ENDO CATCH 10MM SPEC (MISCELLANEOUS) ×2 IMPLANT
SCISSORS METZENBAUM CVD 33 (INSTRUMENTS) ×2 IMPLANT
SLEEVE ENDOPATH XCEL 5M (ENDOMECHANICALS) ×4 IMPLANT
STRIP CLOSURE SKIN 1/2X4 (GAUZE/BANDAGES/DRESSINGS) IMPLANT
SUT MNCRL 4-0 (SUTURE) ×1
SUT MNCRL 4-0 27XMFL (SUTURE) ×1
SUTURE MNCRL 4-0 27XMF (SUTURE) ×1 IMPLANT
TROCAR XCEL 12X100 BLDLESS (ENDOMECHANICALS) ×2 IMPLANT
TROCAR XCEL NON-BLD 5MMX100MML (ENDOMECHANICALS) ×2 IMPLANT
TUBING INSUFFLATOR HI FLOW (MISCELLANEOUS) ×2 IMPLANT

## 2016-08-28 NOTE — Anesthesia Procedure Notes (Signed)
Procedure Name: Intubation Performed by: Casey BurkittHOANG, Nyjae Hodge Pre-anesthesia Checklist: Patient identified, Patient being monitored, Timeout performed, Emergency Drugs available and Suction available Patient Re-evaluated:Patient Re-evaluated prior to inductionOxygen Delivery Method: Circle system utilized Preoxygenation: Pre-oxygenation with 100% oxygen Intubation Type: IV induction Ventilation: Mask ventilation without difficulty Laryngoscope Size: Mac and 3 Grade View: Grade I Tube type: Oral Tube size: 7.5 mm Number of attempts: 1 Airway Equipment and Method: Stylet Placement Confirmation: ETT inserted through vocal cords under direct vision,  positive ETCO2 and breath sounds checked- equal and bilateral Secured at: 22 cm Tube secured with: Tape Dental Injury: Teeth and Oropharynx as per pre-operative assessment  Comments: Very poor dentition noted. Multiple chipped and missing teeth.

## 2016-08-28 NOTE — Anesthesia Postprocedure Evaluation (Signed)
Anesthesia Post Note  Patient: Melvin PootHugo Mendoza-Mejia  Procedure(s) Performed: Procedure(s) (LRB): LAPAROSCOPIC CHOLECYSTECTOMY (N/A)  Patient location during evaluation: PACU Anesthesia Type: General Level of consciousness: awake Pain management: satisfactory to patient Vital Signs Assessment: post-procedure vital signs reviewed and stable Respiratory status: nonlabored ventilation Cardiovascular status: stable Anesthetic complications: no    Last Vitals:  Vitals:   08/28/16 1325 08/28/16 1330  BP: 127/77   Pulse: 82 75  Resp: 15 17  Temp:      Last Pain:  Vitals:   08/28/16 1319  TempSrc:   PainSc: 0-No pain                 VAN STAVEREN,Kohen Reither

## 2016-08-28 NOTE — Discharge Instructions (Signed)
Colecistectoma laparoscpica, cuidados posteriores (Laparoscopic Cholecystec Laparoscopic Cholecystectomy, Care After Refer to this sheet in the next few weeks. These instructions provide you with information about caring for yourself after your procedure. Your health care provider may also give you more specific instructions. Your treatment has been planned according to current medical practices, but problems sometimes occur. Call your health care provider if you have any problems or questions after your procedure. WHAT TO EXPECT AFTER THE PROCEDURE After your procedure, it is common to have: Pain at your incision sites. You will be given pain medicines to control your pain. Mild nausea or vomiting. This should improve after the first 24 hours. Bloating and possible shoulder pain from the gas that was used during the procedure. This will improve after the first 24 hours. HOME CARE INSTRUCTIONS Incision Care Follow instructions from your health care provider about how to take care of your incisions. Make sure you: Wash your hands with soap and water before you change your bandage (dressing). If soap and water are not available, use hand sanitizer. Change your dressing as told by your health care provider. Leave stitches (sutures), skin glue, or adhesive strips in place. These skin closures may need to be in place for 2 weeks or longer. If adhesive strip edges start to loosen and curl up, you may trim the loose edges. Do not remove adhesive strips completely unless your health care provider tells you to do that. Do not take baths, swim, or use a hot tub until your health care provider approves. Ask your health care provider if you can take showers. You may only be allowed to take sponge baths for bathing. General Instructions Take over-the-counter and prescription medicines only as told by your health care provider. Do not drive or operate heavy machinery while taking prescription pain  medicine. Return to your normal diet as told by your health care provider. Do not lift anything that is heavier than 10 lb (4.5 kg). Do not play contact sports for one week or until your health care provider approves. SEEK MEDICAL CARE IF:  You have redness, swelling, or pain at the site of your incision. You have fluid, blood, or pus coming from your incision. You notice a bad smell coming from your incision area. Your surgical incisions break open. You have a fever. SEEK IMMEDIATE MEDICAL CARE IF: You develop a rash. You have difficulty breathing. You have chest pain. You have increasing pain in your shoulders (shoulder strap areas). You faint or have dizzy episodes while you are standing. You have severe pain in your abdomen. You have nausea or vomiting that lasts for more than one day.   This information is not intended to replace advice given to you by your health care provider. Make sure you discuss any questions you have with your health care provider.   Document Released: 11/04/2005 Document Revised: 07/26/2015 Document Reviewed: 06/16/2013 Elsevier Interactive Patient Education 2016 [employer]. tomy, Care After) Estas indicaciones le proporcionan informacin acerca de cmo deber cuidarse despus del procedimiento. El mdico tambin podr darle instrucciones especficas. Comunquese con el mdico si tiene algn problema o tiene preguntas despus del procedimiento. CUIDADOS EN EL HOGAR Cuidado de la incisin  Siga las indicaciones del mdico en lo que respecta al cuidado de los cortes de la ciruga (incisiones). Haga lo siguiente:  [employer] con agua y jabn antes de Multimedia programmer las vendas (vendaje). Use un desinfectante para manos si no dispone de [place] y [place].  Cambie el vendaje como  se lo haya indicado el mdico.  No retire los puntos (suturas), el QUALCOMMadhesivo para la piel o las tiras Charltonadhesivas. Tal vez deban dejarse puestos en la piel durante 2semanas o ms tiempo.  Si las tiras [place]adhesivas se despegan y se enroscan, puede recortar los bordes sueltos. No retire las tiras Agilent Technologiesadhesivas por completo a menos que el mdico lo autorice.  No tome baos de inmersin, no practique natacin ni use el jacuzzi hasta que el mdico lo autorice. Pregntele al mdico si puede ducharse. Delle Reiningal vez solo le permitan darse baos de [place]onja. Instrucciones generales  Baxter Internationalome los medicamentos de venta libre y los recetados solamente como se lo haya indicado el mdico.  No conduzca ni use maquinaria pesada mientras toma analgsicos recetados.  Reanude la dieta normal como se lo haya indicado el mdico.  No levante ningn objeto que pese ms de 10libras (4,5kg).  No practique deportes de contacto durante 1semana o hasta que el mdico lo autorice. SOLICITE AYUDA SI:  Tiene enrojecimiento, hinchazn o Art therapistdolor en el lugar de los cortes quirrgicos.  Tiene secrecin de lquido, sangre o pus que Micron Technologyemana de los cortes.  Percibe que sale mal olor de la zona de las incisiones.  Los cortes quirrgicos se abren.  Tiene fiebre. SOLICITE AYUDA DE INMEDIATO SI:   Tiene una erupcin cutnea.  Tiene dificultad para respirar.  Siente dolor en el pecho.  Siente que Chief Technology Officerel dolor en los hombros (en la zona donde van los breteles) Harrington Challengerempeora.  Se desvanece (se desmaya) o se marea mientras est de pie.  Tiene dolor muy intenso de vientre (abdomen).  Tiene malestar estomacal (nuseas) o vomita durante ms de 1da.   Esta informacin no tiene Theme park managercomo fin reemplazar el consejo del mdico. Asegrese de hacerle al mdico cualquier pregunta que tenga.   Document Released: 07/17/2011 Document Revised: 07/26/2015 Elsevier Interactive Patient Education 2016 Arvin[employer]vier Inc.  [place]akeview EstatesAnestesia general, adultos, cuidados posteriores (General Anesthesia, Adult, Care After) Siga estas instrucciones durante las prximas semanas. Estas indicaciones le proporcionan informacin acerca de cmo deber cuidarse despus del  procedimiento. El mdico tambin podr darle instrucciones ms especficas. El tratamiento se ha planificado de acuerdo a las prcticas mdicas actuales, pero a veces se producen problemas. Comunquese con el mdico si tiene algn problema o tiene dudas despus del procedimiento. QU ESPERAR DESPUS DEL PROCEDIMIENTO Despus del procedimiento es habitual experimentar:  Somnolencia.  Nuseas y vmitos. INSTRUCCIONES PARA EL CUIDADO EN EL HOGAR  Durante las primeras 24 horas luego de la anestesia general:  Haga que una persona responsable se quede con usted.  No conduzca un automvil. Si est solo, no viaje en transporte pblico.  No beba alcohol.  No tome medicamentos que no le haya recetado su mdico.  No firme documentos importantes ni tome decisiones trascendentes.  Puede reanudar su dieta y sus actividades normales segn le haya indicado el mdico.  Cambie los vendajes (apsitos) tal como se le indic.  Si tiene preguntas o se le presenta algn problema relacionado con la anestesia general, comunquese con el hospital y pida por el anestesista o anestesilogo de [place]. SOLICITE ATENCIN MDICA SI:  Tiene nuseas y vmitos durante el da posterior a la anestesia.  Le aparece una erupcin cutnea. SOLICITE ATENCIN MDICA DE INMEDIATO SI:   Tiene dificultad para respirar.  Siente dolor en el pecho.  Tiene algn problema alrgico.   Esta informacin no tiene como fin reemplazar el consejo del mdico. Asegrese de hacerle al mdico cualquier pregunta que tenga.   Document Released:  11/04/2005 Document Revised: 11/25/2014 Elsevier Interactive Patient Education [employer]

## 2016-08-28 NOTE — Op Note (Signed)
Laparoscopic Cholecystectomy  Pre-operative Diagnosis: Biliary Colic   Post-operative Diagnosis: Same  Procedure: Laparoscopic Cholecystectomy  Surgeon: Melvin Most T. Tonita Cong, MD FACS  Anesthesia: Gen. with endotracheal tube  Assistant:None  Procedure Details  The patient was seen again in the Holding Room. The benefits, complications, treatment options, and expected outcomes were discussed with the patient. The risks of bleeding, infection, recurrence of symptoms, failure to resolve symptoms, bile duct damage, bile duct leak, retained common bile duct stone, bowel injury, any of which could require further surgery and/or ERCP, stent, or papillotomy were reviewed with the patient. The likelihood of improving the patient's symptoms with return to their baseline status is good.  The patient and/or family concurred with the proposed plan, giving informed consent.  The patient was taken to Operating Room, identified as Melvin Wood and the procedure verified as Laparoscopic Cholecystectomy.  A Time Out was held and the above information confirmed.  Prior to the induction of general anesthesia, antibiotic prophylaxis was administered. VTE prophylaxis was in place. General endotracheal anesthesia was then administered and tolerated well. After the induction, the abdomen was prepped with Chloraprep and draped in the sterile fashion. The patient was positioned in the supine position.  Local anesthetic  was injected into the skin near the umbilicus and an incision made. The Veress needle was placed. Pneumoperitoneum was then created with CO2 and tolerated well without any adverse changes in the patient's vital signs. A 5mm port was placed in the periumbilical position using the opti-view trocar and the abdominal cavity was explored.  Two 5-mm ports were placed in the right upper quadrant and a 12 mm epigastric port was placed all under direct vision. All skin incisions  were infiltrated with a local  anesthetic agent before making the incision and placing the trocars.   The patient was positioned  in reverse Trendelenburg, tilted slightly to the patient's left.  The gallbladder was identified, the fundus grasped and retracted cephalad. Adhesions were lysed bluntly. The infundibulum was grasped and retracted laterally, exposing the peritoneum overlying the triangle of Calot. This was then divided and exposed in a blunt fashion. A critical view of the cystic duct and cystic artery was obtained.  The cystic duct was clearly identified and bluntly dissected.   The cystic artery was noted to branch early from it's position and required multiple clips for control. The duct was easily isolated and contained in the usual fashion. There was evidence of prior cholecystitis in the area of the gallbladder with some occasional dense adhesions.  The gallbladder was taken from the gallbladder fossa in a retrograde fashion with the electrocautery. The gallbladder was removed and placed in an Endocatch bag. The liver bed was irrigated and inspected. Hemostasis was achieved with the electrocautery. Copious irrigation was utilized and was repeatedly aspirated until clear.  The gallbladder and Endocatch sac were then removed through the epigastric port site.   The Epigastric port site required enlargement with both blunt and sharp techniques in order to remove the very large gallstone in the gallbladder. The bag itself was ruptured while it was being removed.   Inspection of the right upper quadrant was performed. No bleeding, bile duct injury or leak, or bowel injury was noted. The epigastric port site was closed with figure-of-eight 0 Vicryl sutures using an endoclose devise. Pneumoperitoneum was released. 4-0 subcuticular Monocryl was used to close the skin. Steristrips and Mastisol and sterile dressings were  applied.  The patient was then extubated and brought to the recovery room  in stable condition. Sponge, lap,  and needle counts were correct at closure and at the conclusion of the case.   Findings: Previous Cholecystitis   Estimated Blood Loss: 20 mL         Drains: None         Specimens: Gallbladder           Complications: none               Melvin Wood T. Tonita CongWoodham, MD, FACS

## 2016-08-28 NOTE — H&P (View-Only) (Signed)
Patient ID: Melvin Wood, male   DOB: 01/06/1976, 40 y.o.   MRN: [id number]  CC: RUQ PAIN  HPI Melvin Wood is a 40 y.o. male who presents to clinic for evaluation of right upper quadrant pain. Patient reports that he was seen in the emergency department 2 weeks ago after having a meal of fish and french fries which cause right upper quadrant pain that lasted for about 3 hours. It was associated with nausea and vomiting at that time. He reports that the pain has mostly resolved and he has not had any nausea or vomiting since. He's been abiding by the prescribed diet from the ER. He states he's never had any pain like this prior. He denies any current fevers, chills, nausea, vomiting, chest pain, short of breath, diarrhea, constipation. He is otherwise in his usual state of health. He is currently out of work secondary to recovering from an accident.  HPI  Past Medical History:  Diagnosis Date  . Diabetes mellitus without complication (HCC)     Past Surgical History:  Procedure Laterality Date  . SHOULDER SURGERY  12/15/15    History reviewed. No pertinent family history.  Social History Social History  Substance Use Topics  . Smoking status: Never Smoker  . Smokeless tobacco: Never Used  . Alcohol use No    No Known Allergies  Current Outpatient Prescriptions  Medication Sig Dispense Refill  . metFORMIN (GLUCOPHAGE-XR) 500 MG 24 hr tablet Take 2,000 mg by mouth daily with supper.      No current facility-administered medications for this visit.      Review of Systems A Multi-point review of systems was asked and was negative except for the findings documented in the history of present illness  Physical Exam Blood pressure 130/80, pulse (!) 59, temperature 98.1 F (36.7 C), temperature source Oral, height 5' 4" (1.626 m), weight 76.7 kg (169 lb). CONSTITUTIONAL: No acute distress. EYES: Pupils are equal, round, and reactive to light, Sclera are  non-icteric. EARS, NOSE, MOUTH AND THROAT: The oropharynx is clear. The oral mucosa is pink and moist. Hearing is intact to voice. LYMPH NODES:  Lymph nodes in the neck are normal. RESPIRATORY:  Lungs are clear. There is normal respiratory effort, with equal breath sounds bilaterally, and without pathologic use of accessory muscles. CARDIOVASCULAR: Heart is regular without murmurs, gallops, or rubs. GI: The abdomen is soft, minimally tender to deep palpation in the right upper quadrant without rebound, guarding, Murphy's, and nondistended. There are no palpable masses. There is no hepatosplenomegaly. There are normal bowel sounds in all quadrants. GU: Rectal deferred.   MUSCULOSKELETAL: Normal muscle strength and tone. No cyanosis or edema.   SKIN: Turgor is good and there are no pathologic skin lesions or ulcers. NEUROLOGIC: Motor and sensation is grossly normal. Cranial nerves are grossly intact. PSYCH:  Oriented to person, place and time. Affect is normal.  Data Reviewed Images and labs reviewed. Ultrasound from the ER department does show cholelithiasis without any gallbladder wall thickening,. Close fluid, common duct dilatation. This is concerning for biliary colic but not for cholecystitis. Labs reviewed which do show a white blood cell count of 16,000, but the remainder of his LFTs and left lites are within normal limits. He does have a urinalysis which shows evidence of bacteria which may be the source of his leukocytosis. I have personally reviewed the patient's imaging, laboratory findings and medical records.    Assessment    Biliary colic    Plan      40-year-old male with a first bout of right upper quadrant pain consistent with biliary colic. I discussed the procedure in detail.  The patient was given educational material.  We discussed the risks and benefits of a laparoscopic cholecystectomy and possible cholangiogram including, but not limited to bleeding, infection, injury to  surrounding structures such as the intestine or liver, bile leak, retained gallstones, need to convert to an open procedure, prolonged diarrhea, blood clots such as  DVT, common bile duct injury, anesthesia risks, and possible need for additional procedures.  The likelihood of improvement in symptoms and return to the patient's normal status is good. We discussed the typical post-operative recovery course. Patient is wife voiced understanding and desired to proceed. We will plan for laparoscopic cholecystectomy on October 11.      Time spent with the patient was 45 minutes, with more than 50% of the time spent in face-to-face education, counseling and care coordination.     Cambri Plourde, MD FACS General Surgeon 08/13/2016, 10:27 AM    

## 2016-08-28 NOTE — Interval H&P Note (Signed)
History and Physical Interval Note:  08/28/2016 11:08 AM  Juliane PootHugo Mendoza-Mejia  has presented today for surgery, with the diagnosis of bilary colic  The various methods of treatment have been discussed with the patient and family. After consideration of risks, benefits and other options for treatment, the patient has consented to  Procedure(s): LAPAROSCOPIC CHOLECYSTECTOMY (N/A) as a surgical intervention .  The patient's history has been reviewed, patient examined, no change in status, stable for surgery.  I have reviewed the patient's chart and labs.  Questions were answered to the patient's satisfaction.     Ricarda Frameharles Gita Dilger

## 2016-08-28 NOTE — Brief Op Note (Signed)
08/28/2016  12:46 PM  PATIENT:  Juliane PootHugo Mendoza-Mejia  40 y.o. male  PRE-OPERATIVE DIAGNOSIS:  bilary colic  POST-OPERATIVE DIAGNOSIS:  bilary colic  PROCEDURE:  Procedure(s): LAPAROSCOPIC CHOLECYSTECTOMY (N/A)  SURGEON:  Surgeon(s) and Role:    * Ricarda Frameharles Cecil Vandyke, MD - Primary  PHYSICIAN ASSISTANT:   ASSISTANTS: none   ANESTHESIA:   general  EBL:  Total I/O In: 600 [I.V.:600] Out: 20 [Blood:20]  BLOOD ADMINISTERED:none  DRAINS: none   LOCAL MEDICATIONS USED:  MARCAINE   , XYLOCAINE  and Amount: 20 ml  SPECIMEN:  Source of Specimen:  gallbladder  DISPOSITION OF SPECIMEN:  PATHOLOGY  COUNTS:  YES  TOURNIQUET:  * No tourniquets in log *  DICTATION: .Dragon Dictation  PLAN OF CARE: Discharge to home after PACU  PATIENT DISPOSITION:  PACU - hemodynamically stable.   Delay start of Pharmacological VTE agent (>24hrs) due to surgical blood loss or risk of bleeding: no

## 2016-08-28 NOTE — Transfer of Care (Signed)
Immediate Anesthesia Transfer of Care Note  Patient: Melvin Wood  Procedure(s) Performed: Procedure(s): LAPAROSCOPIC CHOLECYSTECTOMY (N/A)  Patient Location: PACU  Anesthesia Type:General  Level of Consciousness: sedated  Airway & Oxygen Therapy: Patient Spontanous Breathing and Patient connected to face mask oxygen  Post-op Assessment: Report given to RN and Post -op Vital signs reviewed and stable  Post vital signs: Reviewed and stable  Last Vitals:  Vitals:   08/28/16 0957 08/28/16 1254  BP: (!) 126/96 122/82  Pulse: 63 71  Resp: 16 19  Temp: 36.3 C 36.7 C    Last Pain:  Vitals:   08/28/16 0957  TempSrc: Tympanic         Complications: No apparent anesthesia complications

## 2016-08-28 NOTE — Anesthesia Preprocedure Evaluation (Signed)
Anesthesia Evaluation  Patient identified by MRN, date of birth, ID band Patient awake    Reviewed: Allergy & Precautions, NPO status , Patient's Chart, lab work & pertinent test results  History of Anesthesia Complications Negative for: history of anesthetic complications  Airway Mallampati: II       Dental  (+) Poor Dentition, Missing, Loose, Chipped,    Pulmonary neg pulmonary ROS,           Cardiovascular negative cardio ROS       Neuro/Psych negative neurological ROS     GI/Hepatic negative GI ROS, Neg liver ROS,   Endo/Other  diabetes, Type 2, Oral Hypoglycemic Agents  Renal/GU negative Renal ROS     Musculoskeletal   Abdominal   Peds  Hematology negative hematology ROS (+)   Anesthesia Other Findings   Reproductive/Obstetrics                            Anesthesia Physical Anesthesia Plan  ASA: II  Anesthesia Plan: General   Post-op Pain Management:    Induction: Intravenous  Airway Management Planned: Oral ETT  Additional Equipment:   Intra-op Plan:   Post-operative Plan:   Informed Consent: I have reviewed the patients History and Physical, chart, labs and discussed the procedure including the risks, benefits and alternatives for the proposed anesthesia with the patient or authorized representative who has indicated his/her understanding and acceptance.     Plan Discussed with:   Anesthesia Plan Comments:         Anesthesia Quick Evaluation

## 2016-08-29 LAB — SURGICAL PATHOLOGY

## 2016-09-06 ENCOUNTER — Encounter: Payer: Self-pay | Admitting: General Surgery

## 2016-09-06 ENCOUNTER — Ambulatory Visit (INDEPENDENT_AMBULATORY_CARE_PROVIDER_SITE_OTHER): Payer: Managed Care, Other (non HMO) | Admitting: General Surgery

## 2016-09-06 VITALS — BP 122/73 | HR 71 | Temp 99.0°F | Ht 60.0 in | Wt 167.0 lb

## 2016-09-06 DIAGNOSIS — Z4889 Encounter for other specified surgical aftercare: Secondary | ICD-10-CM

## 2016-09-06 NOTE — Progress Notes (Signed)
Outpatient Surgical Follow Up  09/06/2016  Melvin Wood is an 40 y.o. male.   Chief Complaint  Patient presents with  . Routine Post Op    Laparoscopic Cholecystectomy 08/28/16 Dr. Tonita CongWoodham     HPI: 40 year old male returns to clinic 1 week status post laparoscopic cholecystectomy. Patient reports doing well. He is not requiring any pain medications, he is eating well, he is having bowel movements. He denies any fevers, chills, nausea, vomiting, chest pain, shortness breath, diarrhea, constipation.  Past Medical History:  Diagnosis Date  . Diabetes mellitus without complication (HCC)   . Gallstones     Past Surgical History:  Procedure Laterality Date  . CHOLECYSTECTOMY N/A 08/28/2016   Procedure: LAPAROSCOPIC CHOLECYSTECTOMY;  Surgeon: Ricarda Frameharles Kaniya Trueheart, MD;  Location: [facility] ORS;  Service: General;  Laterality: N/A;  . SHOULDER SURGERY  12/15/15    Family History  Problem Relation Age of Onset  . Diabetes Mother   . Hypertension Mother   . Diabetes Father   . Diabetes Brother   . Diabetes Paternal Uncle     Social History:  reports that he has never smoked. He has never used smokeless tobacco. He reports that he does not drink alcohol or use drugs.  Allergies: No Known Allergies  Medications reviewed.    ROS A multipoint review of systems was completed, all pertinent positives and negatives are documented within the history of present illness and remainder are negative.   BP 122/73 (BP Location: Right Arm, Patient Position: Sitting)   Pulse 71   Temp 99 F (37.2 C) (Oral)   Ht 5' (1.524 m)   Wt 75.8 kg (167 lb)   BMI 32.61 kg/m   Physical Exam Gen.: No acute distress Chest: Clear to auscultation Heart: Regular rhythm Abdomen: Soft, nontender, nondistended. Well approximated laparoscopic cholecystectomy incisions without any evidence of erythema or infection.    No results found for this or any previous visit (from the past 48 hour(s)). No results  found.  Assessment/Plan:  1. Aftercare following surgery 40 year old male status post laparoscopic cholecystectomy. Pathology reviewed with the patient and the family. Provided with standard precautions for returning to activities and for wound care. They voiced understanding and will follow-up in clinic on an as-needed basis.     Ricarda Frameharles Ashey Tramontana, MD FACS General Surgeon  09/06/2016,12:09 PM

## 2016-09-06 NOTE — Patient Instructions (Signed)

## 2019-06-11 ENCOUNTER — Other Ambulatory Visit: Payer: Self-pay

## 2019-06-11 DIAGNOSIS — Z20822 Contact with and (suspected) exposure to covid-19: Secondary | ICD-10-CM

## 2019-06-14 LAB — NOVEL CORONAVIRUS, NAA: SARS-CoV-2, NAA: NOT DETECTED

## 2020-02-04 ENCOUNTER — Ambulatory Visit: Payer: Managed Care, Other (non HMO) | Attending: Internal Medicine

## 2020-02-04 DIAGNOSIS — Z23 Encounter for immunization: Secondary | ICD-10-CM

## 2020-02-04 NOTE — Progress Notes (Signed)
   Covid-19 Vaccination Clinic  Name:  Mikal Blasdell    MRN: [id number] DOB: 1975/11/25  02/04/2020  Mr. Sereno was observed post Covid-19 immunization for 15 minutes without incident. He was provided with Vaccine Information Sheet and instruction to access the V-Safe system.   Mr. Jasperson was instructed to call 911 with any severe reactions post vaccine: Marland Kitchen Difficulty breathing  . Swelling of face and throat  . A fast heartbeat  . A bad rash all over body  . Dizziness and weakness   Immunizations Administered    Name Date Dose VIS Date Route   Pfizer COVID-19 Vaccine 02/04/2020  5:52 PM 0.3 mL 10/29/2019 Intramuscular   Manufacturer: [employer], [employer]   Lot: [id number]   NDC: [address]-2

## 2020-02-25 ENCOUNTER — Ambulatory Visit: Payer: Managed Care, Other (non HMO) | Attending: Internal Medicine

## 2020-02-25 DIAGNOSIS — Z23 Encounter for immunization: Secondary | ICD-10-CM

## 2020-02-25 NOTE — Progress Notes (Signed)
   Covid-19 Vaccination Clinic  Name:  Melvin Wood    MRN: [id number] DOB: 10-Mar-1976  02/25/2020  Melvin Wood was observed post Covid-19 immunization for 15 minutes without incident. He was provided with Vaccine Information Sheet and instruction to access the V-Safe system.   Melvin Wood was instructed to call 911 with any severe reactions post vaccine: Marland Kitchen Difficulty breathing  . Swelling of face and throat  . A fast heartbeat  . A bad rash all over body  . Dizziness and weakness   Immunizations Administered    Name Date Dose VIS Date Route   Pfizer COVID-19 Vaccine 02/25/2020  5:49 PM 0.3 mL 10/29/2019 Intramuscular   Manufacturer: [employer], [employer]   Lot: [id number]   NDC: [address]-4

## 2024-05-17 IMAGING — MR RM JOELHO DIREITO
4 of 5 series · 12 of 40 positions shown · non-contrast
Comparison: none

[Series 301: cor dp spair · coronal · 4.0mm · 0.22mm/px · 3 of 20 slices shown]
[im 3/20]
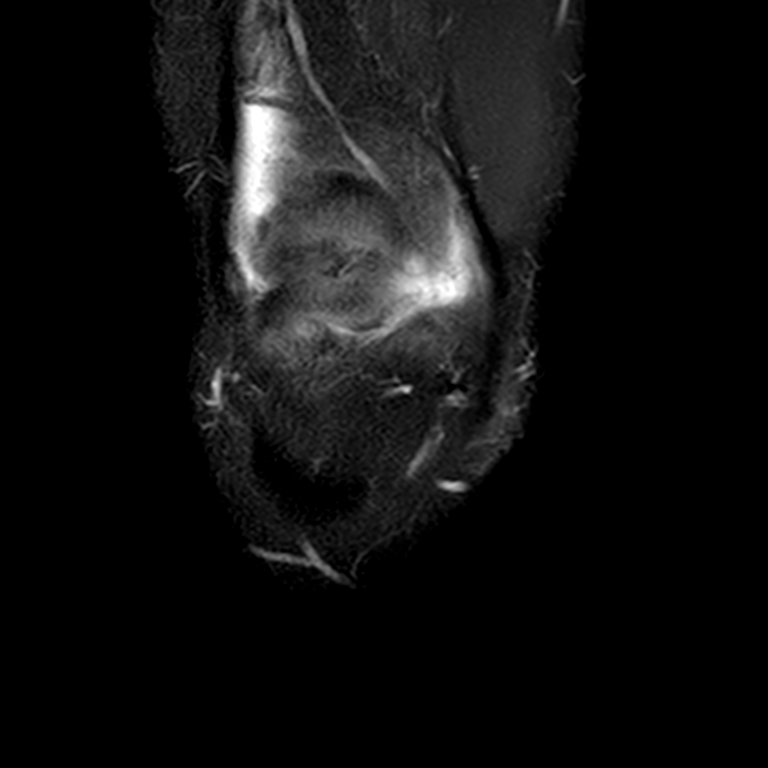
[im 11/20]
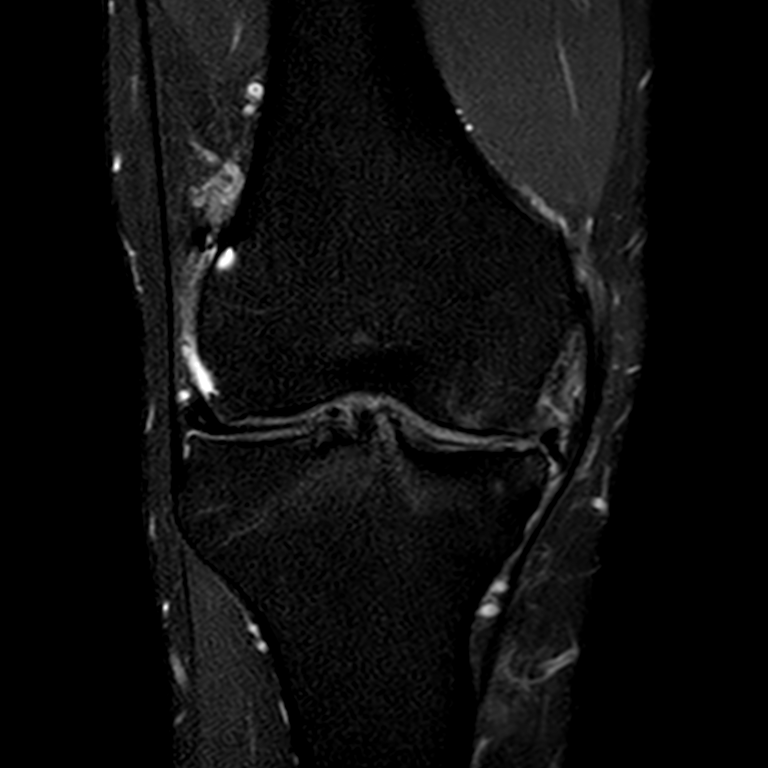
[im 17/20]
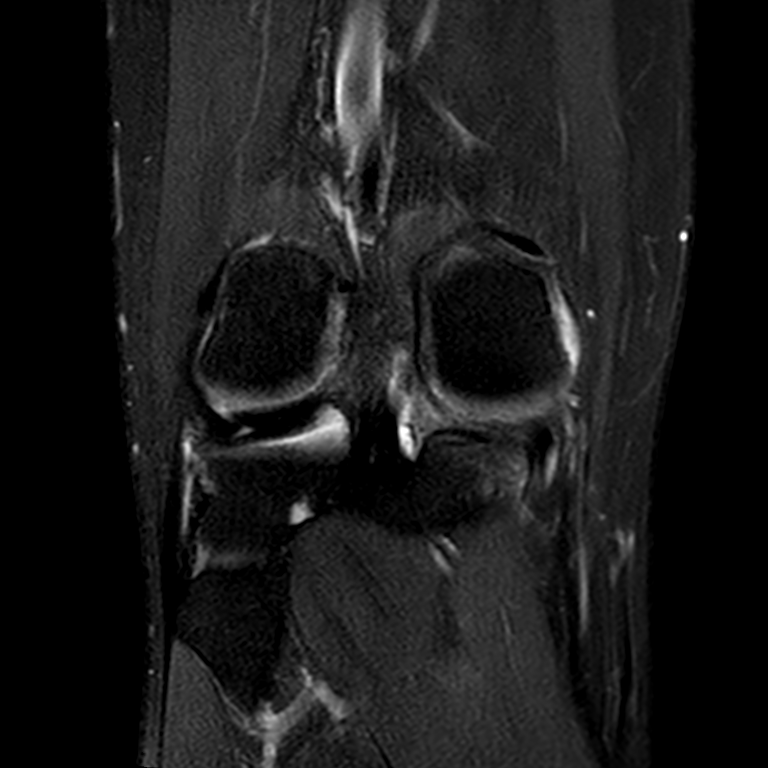

[Series 401: T1 · sagittal · 4.0mm · 0.27mm/px · 3 of 22 slices shown]
[im 4/22]
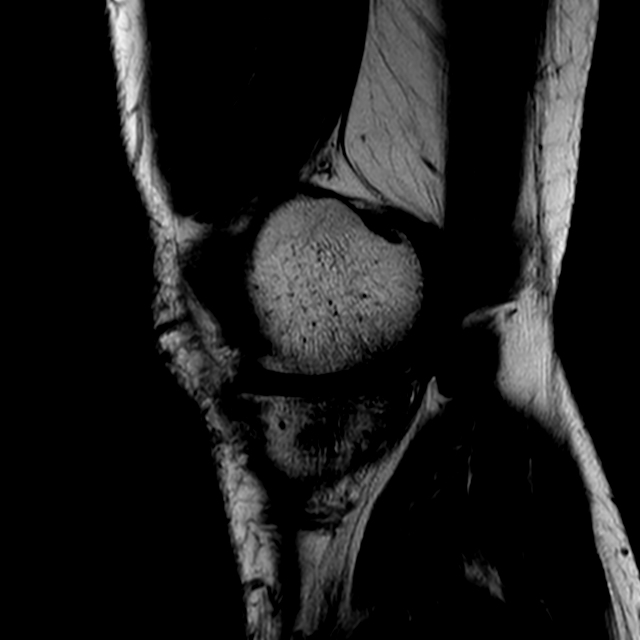
[im 13/22]
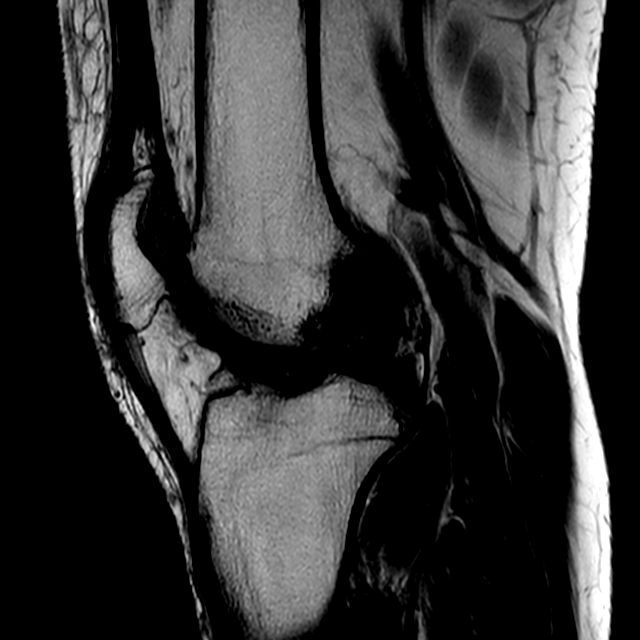
[im 19/22]
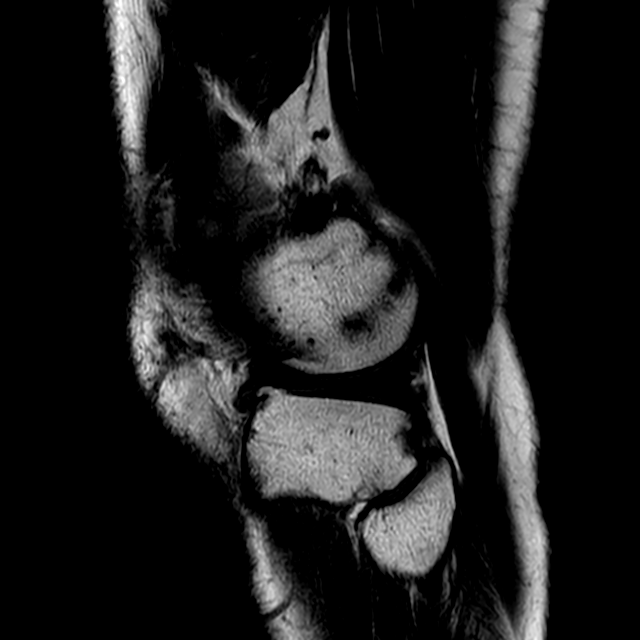

[Series 501: T2 fat-sat · sagittal · 4.0mm · 0.22mm/px · 3 of 22 slices shown]
[im 4/22]
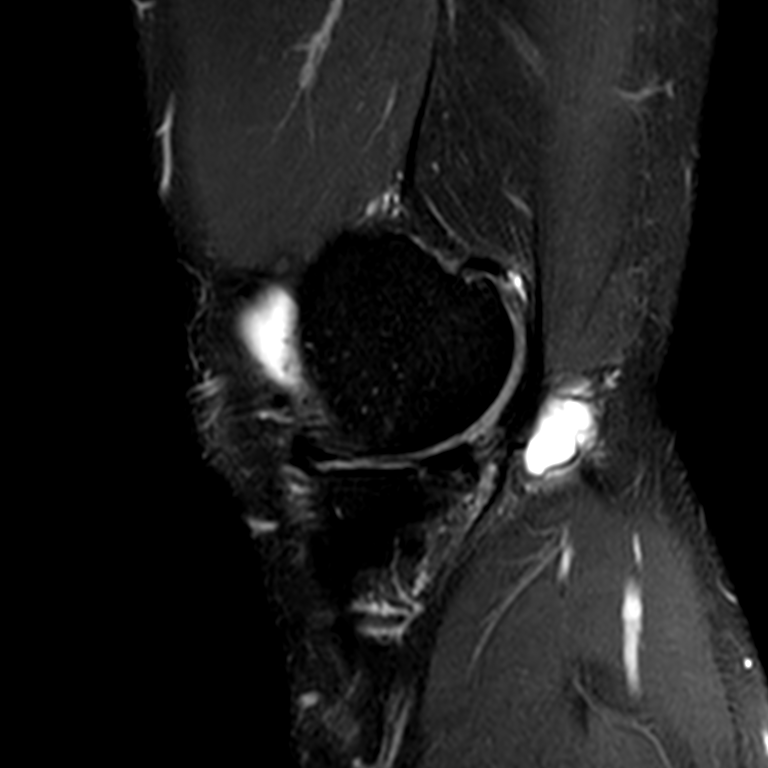
[im 13/22]
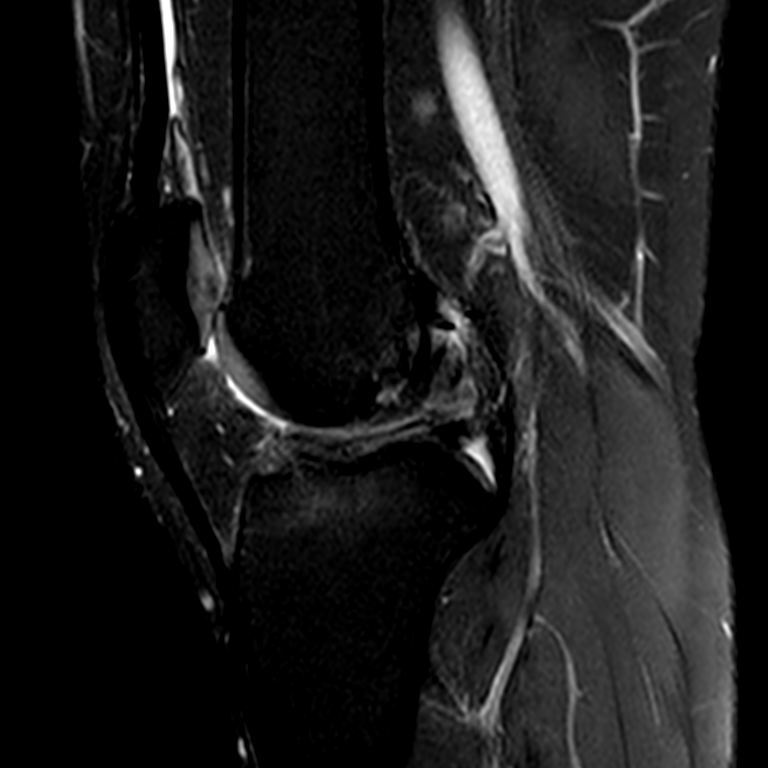
[im 19/22]
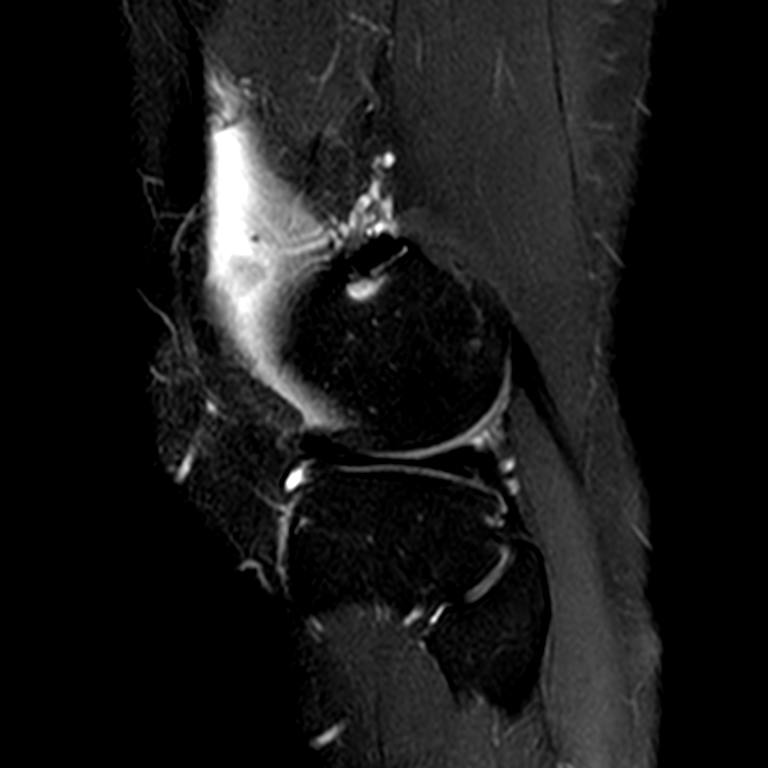

[Series 601: axi dp fs · axial · 4.0mm · 0.37mm/px · z∈[-60,+44]mm · 3 of 30 slices shown]
[im 3/30]
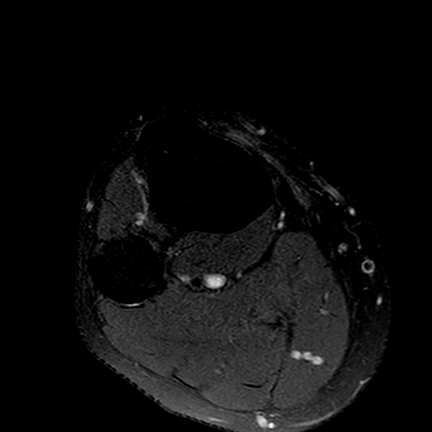
[im 15/30]
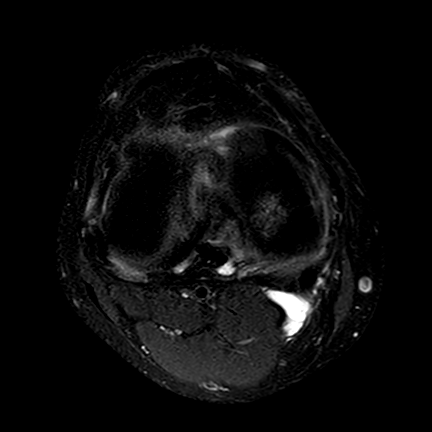
[im 27/30]
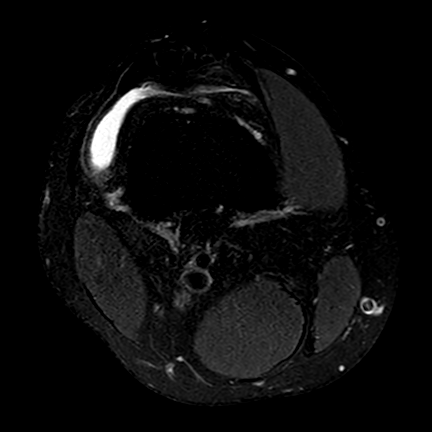

[12 of 40 positions shown; findings below may reference images not displayed]

RESSONÂNCIA MAGNÉTICA DO JOELHO DIREITO

TÉCNICA:

Exame realizado em equipamento de ressonância magnética com sequências, ponderações e planos específicos para o segmento de interesse, sem a administração endovenosa do meio de contraste.

RESULTADO:
Osteofitos nos três compartimentos do joelho com redução do espaço articular femoro tibial medial e irregularidade na superficie condral.
Hipersinal obliquo no corno posterior do menisco lateral, em contato com a superficie articular, compativel com rotura.
Hipersinal obliquo no corno posterior do menisco medial, em contato com a superficie articular, compativel com rotura.
Sinais de reconstrução do ligamento cruzado anterior com fixação por parafusos, que apresenta ser aspecto usual.
Ligamentos cruzados e colaterais com continuidade, espessura e sinal conservados.
Erosões condrais superficiais na faceta da patela.
Tendões quadricipital, patelar, bíceps femoral distal, poplíteo, trato iliotibial e tendões da pata de ganso sem particularidades.
Derrame articular leve.
Cisto poplíteo, medindo 1,6 x 1,2cm.

CONCLUSÃO:
Osteoartrose.
Rotura do menisco lateral e medial.
Erosões condrais superficiais na faceta da patela.
Derrame articular leve.
Cisto poplíteo.
